# Patient Record
Sex: Male | Born: 1990 | Race: Black or African American | Hispanic: No | Smoking: Never smoker
Health system: Southern US, Community
[De-identification: ages and names within clinical notes are randomized; demographics above are authoritative.]

---

## 2016-07-30 ENCOUNTER — Encounter (HOSPITAL_COMMUNITY): Payer: Self-pay | Admitting: Emergency Medicine

## 2016-07-30 ENCOUNTER — Emergency Department (HOSPITAL_COMMUNITY)
Admission: EM | Admit: 2016-07-30 | Discharge: 2016-07-30 | Disposition: A | Discharge status: Home / Self Care | Payer: Medicare HMO | Attending: Emergency Medicine | Admitting: Emergency Medicine

## 2016-07-30 DIAGNOSIS — S8992XA Unspecified injury of left lower leg, initial encounter: Secondary | ICD-10-CM | POA: Diagnosis present

## 2016-07-30 DIAGNOSIS — Y93A2 Activity, calisthenics: Secondary | ICD-10-CM | POA: Diagnosis not present

## 2016-07-30 DIAGNOSIS — Y999 Unspecified external cause status: Secondary | ICD-10-CM | POA: Diagnosis not present

## 2016-07-30 DIAGNOSIS — M79662 Pain in left lower leg: Secondary | ICD-10-CM | POA: Diagnosis not present

## 2016-07-30 DIAGNOSIS — Y9289 Other specified places as the place of occurrence of the external cause: Secondary | ICD-10-CM | POA: Insufficient documentation

## 2016-07-30 DIAGNOSIS — X509XXA Other and unspecified overexertion or strenuous movements or postures, initial encounter: Secondary | ICD-10-CM | POA: Diagnosis not present

## 2016-07-30 DIAGNOSIS — M79605 Pain in left leg: Secondary | ICD-10-CM

## 2016-07-30 MED ORDER — IBUPROFEN 600 MG PO TABS: 600.0000 mg | ORAL_TABLET | Freq: Four times a day (QID) | ORAL | 0 refills | Status: DC | PRN

## 2016-07-30 NOTE — ED Provider Notes (Signed)
[facility]EMER[facility]ENCY DEPT Provider Note   CSN: [id number] Arrival date & time: 07/30/16  4098     History   Chief Complaint Chief Complaint  Patient presents with  . Leg Pain    HPI Mark Floyd is a 26 y.o. male.  HPI   Mark Floyd is a 26 y.o. male, patient with no pertinent past medical history, presenting to the ED with left lower leg pain and swelling beginning yesterday. Patient endorses increase in his physical activity including calisthenics such as jumping jacks. Describes the pain as a mild soreness. Plantar flexion and ambulation worsen the pain. He has tried Epsom salt baths without relief. Denies neuro deficits, trauma, or any other complaints No recent travel, immobilization, surgery, or orthopedic trauma. No history of DVT/PE.       History reviewed. No pertinent past medical history.  There are no active problems to display for this patient.   History reviewed. No pertinent surgical history.     Home Medications    Prior to Admission medications   Medication Sig Start Date End Date Taking? Authorizing Provider  ibuprofen (ADVIL,MOTRIN) 600 MG tablet Take 1 tablet (600 mg total) by mouth every 6 (six) hours as needed. 07/30/16   Anselm Pancoast, PA-C    Family History History reviewed. No pertinent family history.  Social History Social History  Substance Use Topics  . Smoking status: Never Smoker  . Smokeless tobacco: Never Used  . Alcohol use Yes     Allergies   Patient has no known allergies.   Review of Systems Review of Systems  Musculoskeletal: Positive for myalgias. Negative for back pain.  Skin: Negative for wound.  Neurological: Negative for weakness and numbness.     Physical Exam Updated Vital Signs BP (!) 159/97 (BP Location: Left Arm)   Pulse 78   Temp 97.4 F (36.3 C) (Oral)   Resp 20   SpO2 100%   Physical Exam  Constitutional: He appears well-developed and well-nourished. No distress.  HENT:  Head:  Normocephalic and atraumatic.  Eyes: Conjunctivae are normal.  Neck: Neck supple.  Cardiovascular: Normal rate, regular rhythm and intact distal pulses.   Pulmonary/Chest: Effort normal.  Musculoskeletal: He exhibits tenderness.  Mild tenderness over anterior lower left leg, lateral to tibia. No tenderness over tibia or ankle. No tenderness to the calf. No erythema or increased warmth. Plantar and dorsiflexion increased pain. Patient is ambulatory.  Neurological: He is alert.  No sensory deficits in the lower left extremity. Strength 5 out of 5 with extension and flexion of the left knee as well as dorsiflexion and plantar flexion on the left.  Skin: Skin is warm and dry. Capillary refill takes less than 2 seconds. He is not diaphoretic.  Psychiatric: He has a normal mood and affect. His behavior is normal.  Nursing note and vitals reviewed.    ED Treatments / Results  Labs (all labs ordered are listed, but only abnormal results are displayed) Labs Reviewed - No data to display  EKG  EKG Interpretation None       Radiology No results found.  Procedures Procedures (including critical care time)  Medications Ordered in ED Medications - No data to display   Initial Impression / Assessment and Plan / ED Course  I have reviewed the triage vital signs and the nursing notes.  Pertinent labs & imaging results that were available during my care of the patient were reviewed by me and considered in my medical decision making (see  chart for details).      Patient presents with lower leg pain. Very low suspicion for DVT. Wells criteria for DVT is 0. Supportive care and return precautions discussed. Patient voiced understanding of all instructions and is comfortable with discharge.    Final Clinical Impressions(s) / ED Diagnoses   Final diagnoses:  Left leg pain    New Prescriptions New Prescriptions   IBUPROFEN (ADVIL,MOTRIN) 600 MG TABLET    Take 1 tablet (600 mg total)  by mouth every 6 (six) hours as needed.     Anselm PancoastJoy, Keneshia Tena C, PA-C 07/30/16 1025    Alvira MondaySchlossman, Erin, MD 08/01/16 Moses Manners0025

## 2016-07-30 NOTE — ED Triage Notes (Signed)
Pt sts left leg pain in shin area

## 2016-07-30 NOTE — Discharge Instructions (Signed)
°  Pain: Take 600 mg of ibuprofen every 6 hours or 440 mg of naproxen every 12 hours for the next 3 days. After this, may take ibuprofen or naproxen as needed to reduce pain and inflammation. Take these types of medications with food to avoid upset stomach. Choose one of these medications, but not both.  Ice: May apply ice to the area over the next 24 hours for 15 minutes at a time to reduce swelling. Elevation: Keep the extremity elevated as often as possible to reduce pain and inflammation. Exercises: Start by performing these exercises a few times a week, increasing the frequency until you are performing them twice daily.  Follow up: If symptoms are improving, you may follow up with your primary care provider for any continued management. If symptoms are not improving, you may follow up with the orthopedic specialist. If symptoms worsen, return to the ED.

## 2016-08-03 ENCOUNTER — Ambulatory Visit (INDEPENDENT_AMBULATORY_CARE_PROVIDER_SITE_OTHER): Payer: Medicare HMO

## 2016-08-03 ENCOUNTER — Ambulatory Visit (HOSPITAL_COMMUNITY)
Admission: EM | Admit: 2016-08-03 | Discharge: 2016-08-03 | Disposition: A | Discharge status: Home / Self Care | Payer: Medicare HMO | Attending: Internal Medicine | Admitting: Internal Medicine

## 2016-08-03 ENCOUNTER — Encounter (HOSPITAL_COMMUNITY): Payer: Self-pay | Admitting: Emergency Medicine

## 2016-08-03 DIAGNOSIS — M79605 Pain in left leg: Secondary | ICD-10-CM

## 2016-08-03 MED ORDER — PREDNISONE 10 MG PO TABS: ORAL_TABLET | ORAL | 0 refills | Status: DC

## 2016-08-03 NOTE — ED Triage Notes (Signed)
Pt c/o LLE pain onset 4-5 days  [facility]  Does not recall any inj/trauma  Steady gait... Ambulatory... NAD... A&O x4

## 2016-08-03 NOTE — ED Provider Notes (Signed)
CSN: [id number]     Arrival date & time 08/03/16  1636 History   None    Chief Complaint  Patient presents with  . Leg Pain   (Consider location/radiation/quality/duration/timing/severity/associated sxs/prior Treatment) The history is provided by the patient. No language interpreter was used.  Leg Pain  Location:  Leg Leg location:  L leg Pain details:    Quality:  Aching   Radiates to:  Does not radiate   Severity:  Moderate   Onset quality:  Gradual   Timing:  Constant   Progression:  Worsening Chronicity:  New Dislocation: no   Foreign body present:  No foreign bodies Relieved by:  Nothing Worsened by:  Nothing Ineffective treatments:  None tried  Pt complains of swelling to the front of his left leg.  History reviewed. No pertinent past medical history. History reviewed. No pertinent surgical history. History reviewed. No pertinent family history. Social History  Substance Use Topics  . Smoking status: Never Smoker  . Smokeless tobacco: Never Used  . Alcohol use Yes    Review of Systems  All other systems reviewed and are negative.   Allergies  Patient has no known allergies.  Home Medications   Prior to Admission medications   Medication Sig Start Date End Date Taking? Authorizing Provider  ibuprofen (ADVIL,MOTRIN) 600 MG tablet Take 1 tablet (600 mg total) by mouth every 6 (six) hours as needed. 07/30/16   Joy, Hillard Danker, PA-C  predniSONE (DELTASONE) 10 MG tablet 6,5,4,3,2,1 taper 08/03/16   Elson Areas, PA-C   Meds Ordered and Administered this Visit  Medications - No data to display  BP 135/86 (BP Location: Left Arm)   Pulse 95   Temp 98.8 F (37.1 C) (Oral)   Resp 16   SpO2 96%  No data found.   Physical Exam  Constitutional: He is oriented to person, place, and time. He appears well-developed and well-nourished.  HENT:  Head: Normocephalic.  Musculoskeletal: He exhibits tenderness.  Tender anterior shin,  Edematous,  No heat.    Neurological: He is alert and oriented to person, place, and time.  Skin: Skin is warm.  Psychiatric: He has a normal mood and affect.    Urgent Care Course     Procedures (including critical care time)  Labs Review Labs Reviewed - No data to display  Imaging Review Dg Tibia/fibula Left  Result Date: 08/03/2016 CLINICAL DATA:  Leg swelling EXAM: LEFT TIBIA AND FIBULA - 2 VIEW COMPARISON:  None. FINDINGS: No acute bony abnormality is seen. Mild soft tissue edema is noted in the upper lay consistent with the given clinical history. IMPRESSION: No acute bony abnormality noted.  Mild soft tissue swelling is seen. Electronically Signed   By: Alcide Clever M.D.   On: 08/03/2016 18:47     Visual Acuity Review  Right Eye Distance:   Left Eye Distance:   Bilateral Distance:    Right Eye Near:   Left Eye Near:    Bilateral Near:         MDM negative homan's no sign of dvt.  All swelling is anterior,    1. Left leg pain    An After Visit Summary was printed and given to the patient. Meds ordered this encounter  Medications  . predniSONE (DELTASONE) 10 MG tablet    Sig: 6,5,4,3,2,1 taper    Dispense:  21 tablet    Refill:  0    Order Specific Question:   Supervising Provider    Answer:  Eustace MooreMURRAY, LAURA W [[id number]]       Elson AreasSofia, Oluwanifemi Susman K, [place]PA-C 08/03/16 2021

## 2016-08-07 ENCOUNTER — Ambulatory Visit (HOSPITAL_COMMUNITY)
Admission: EM | Admit: 2016-08-07 | Discharge: 2016-08-07 | Disposition: A | Discharge status: Home / Self Care | Payer: Medicare HMO | Attending: Family Medicine | Admitting: Family Medicine

## 2016-08-07 ENCOUNTER — Encounter (HOSPITAL_COMMUNITY): Payer: Self-pay | Admitting: *Deleted

## 2016-08-07 DIAGNOSIS — M79605 Pain in left leg: Secondary | ICD-10-CM

## 2016-08-07 MED ORDER — PREDNISONE 10 MG (21) PO TBPK: ORAL_TABLET | Freq: Every day | ORAL | 0 refills | Status: DC

## 2016-08-07 NOTE — ED Triage Notes (Signed)
Pt  States   Was  Moving   3  Days     Ago     And  inj  l   Ankle    Swelling  Noted      Pt  Here  Today  For  followup      Pt  Has  A  Cam  Walker  In place  On  Arrival

## 2016-08-07 NOTE — ED Provider Notes (Signed)
CSN: [id number]     Arrival date & time 08/07/16  1016 History   First MD Initiated Contact with Patient 08/07/16 1056     Chief Complaint  Patient presents with  . Follow-up   (Consider location/radiation/quality/duration/timing/severity/associated sxs/prior Treatment) 26 year old male presents to clinic with a chief complaint of left lower leg pain and swelling. He arrived to clinic wearing a cam walker, he has been seen twice before for this complaint, the first on 07/30/2016, and the second Aug 03 2016, states that he initially injured his leg, while helping a friend move. He is unsure how was injured, as he did not fall, and he did not twist his ankle. Both workups were negative with low index of suspicion from the providers for DVT/PE, cellulitis, lymphedema, etc. He was placed on a course of steroids, along with being given a cam walker. Today he states his swelling and pain is significantly improved, and he has much more able to walk, and bear weight, however he is still having some residual swelling, and some minor pain, 2 out of 10. He has not yet followed up with orthopedics, and is requesting an extension of the prednisone.   The history is provided by the patient.    History reviewed. No pertinent past medical history. History reviewed. No pertinent surgical history. History reviewed. No pertinent family history. Social History  Substance Use Topics  . Smoking status: Never Smoker  . Smokeless tobacco: Never Used  . Alcohol use Yes    Review of Systems  Constitutional: Negative.   HENT: Negative.   Respiratory: Negative.   Cardiovascular: Negative.   Gastrointestinal: Negative.   Musculoskeletal:       Left leg pain and swelling  Skin: Negative.   Neurological: Negative.     Allergies  Patient has no known allergies.  Home Medications   Prior to Admission medications   Medication Sig Start Date End Date Taking? Authorizing Provider  ibuprofen (ADVIL,MOTRIN) 600  MG tablet Take 1 tablet (600 mg total) by mouth every 6 (six) hours as needed. 07/30/16   Joy, Shawn C, PA-C  predniSONE (STERAPRED UNI-PAK 21 TAB) 10 MG (21) TBPK tablet Take by mouth daily. Take 6 tabs by mouth daily  for 2 days, then 5 tabs for 2 days, then 4 tabs for 2 days, then 3 tabs for 2 days, 2 tabs for 2 days, then 1 tab by mouth daily for 2 days 08/07/16   Dorena Bodo, NP   Meds Ordered and Administered this Visit  Medications - No data to display  BP 132/70 (BP Location: Right Arm)   Pulse 78   Temp 98.6 F (37 C) (Oral)   Resp 18   SpO2 100%  No data found.   Physical Exam  Constitutional: He is oriented to person, place, and time. He appears well-developed and well-nourished. No distress.  HENT:  Head: Normocephalic.  Right Ear: External ear normal.  Left Ear: External ear normal.  Eyes: Conjunctivae are normal.  Neck: Normal range of motion.  Cardiovascular: Normal rate and regular rhythm.   Musculoskeletal: He exhibits edema. He exhibits no deformity.  Left lower leg swelling noted, no erythema, or warmth, pulses remain intact distally, capillary refill less than 2 seconds, no tenderness over the medial, or lateral malleolus, cuboid, 5th metatarsal, or the base of the great toe. Negative Homans sign, no palpable cords.  Neurological: He is alert and oriented to person, place, and time.  Skin: Skin is warm and dry. Capillary refill  takes less than 2 seconds. He is not diaphoretic.  Psychiatric: He has a normal mood and affect. His behavior is normal.  Nursing note and vitals reviewed.   Urgent Care Course     Procedures (including critical care time)  Labs Review Labs Reviewed - No data to display  Imaging Review No results found.    MDM   1. Pain of left lower extremity    Education provided to the patient on the effects of long-term use of steroids, and the various issues that can be called by long-term use of steroids, patient provided a new  taper, but strongly encouraged not to take any additional steroids for at least 4 months or longer, provided contact information for orthopedist, and recommend he follow up with him for further treatment and evaluation of his condition if necessary.    Dorena BodoKennard, Treyshaun Keatts, NP 08/07/16 1118

## 2016-08-07 NOTE — Discharge Instructions (Signed)
I have increased the length of your steroid taper, once you are finished with this taper, I would highly advise against use of steroids again for at least 4 months. I have provided the contact information for orthopedist, I would recommend following up with him for further evaluation and management of your condition.

## 2016-08-09 NOTE — ED Notes (Signed)
Doctor, general practiceront  Office   Staff     Stated  Patient needed  A  referell     Records  Printed    And  Given to  [employer] [facility]   Records

## 2016-09-19 ENCOUNTER — Encounter (HOSPITAL_COMMUNITY): Payer: Self-pay | Admitting: Emergency Medicine

## 2016-09-19 ENCOUNTER — Emergency Department (HOSPITAL_COMMUNITY)
Admission: EM | Admit: 2016-09-19 | Discharge: 2016-09-19 | Disposition: A | Discharge status: Home / Self Care | Payer: Medicare HMO | Attending: Emergency Medicine | Admitting: Emergency Medicine

## 2016-09-19 DIAGNOSIS — Y939 Activity, unspecified: Secondary | ICD-10-CM | POA: Diagnosis not present

## 2016-09-19 DIAGNOSIS — Y999 Unspecified external cause status: Secondary | ICD-10-CM | POA: Insufficient documentation

## 2016-09-19 DIAGNOSIS — M25512 Pain in left shoulder: Secondary | ICD-10-CM | POA: Insufficient documentation

## 2016-09-19 DIAGNOSIS — Y92410 Unspecified street and highway as the place of occurrence of the external cause: Secondary | ICD-10-CM | POA: Insufficient documentation
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
## 2016-09-19 NOTE — Discharge Instructions (Signed)
Return if any problems.

## 2016-11-02 ENCOUNTER — Emergency Department (HOSPITAL_COMMUNITY): Payer: No Typology Code available for payment source

## 2016-11-02 ENCOUNTER — Emergency Department (HOSPITAL_COMMUNITY)
Admission: EM | Admit: 2016-11-02 | Discharge: 2016-11-02 | Disposition: A | Discharge status: Home / Self Care | Payer: No Typology Code available for payment source | Attending: Emergency Medicine | Admitting: Emergency Medicine

## 2016-11-02 ENCOUNTER — Encounter (HOSPITAL_COMMUNITY): Payer: Self-pay | Admitting: Emergency Medicine

## 2016-11-02 DIAGNOSIS — R51 Headache: Secondary | ICD-10-CM | POA: Insufficient documentation

## 2016-11-02 DIAGNOSIS — Y999 Unspecified external cause status: Secondary | ICD-10-CM | POA: Diagnosis not present

## 2016-11-02 DIAGNOSIS — R000 Tachycardia, unspecified: Secondary | ICD-10-CM | POA: Insufficient documentation

## 2016-11-02 DIAGNOSIS — Y929 Unspecified place or not applicable: Secondary | ICD-10-CM | POA: Insufficient documentation

## 2016-11-02 DIAGNOSIS — Y939 Activity, unspecified: Secondary | ICD-10-CM | POA: Insufficient documentation

## 2016-11-02 DIAGNOSIS — S80811A Abrasion, right lower leg, initial encounter: Secondary | ICD-10-CM | POA: Diagnosis not present

## 2016-11-02 DIAGNOSIS — T148XXA Other injury of unspecified body region, initial encounter: Secondary | ICD-10-CM | POA: Diagnosis present

## 2016-11-02 LAB — COMPREHENSIVE METABOLIC PANEL
ALBUMIN: 4.6 g/dL (ref 3.5–5.0)
ALK PHOS: 55 U/L (ref 38–126)
ALT: 28 U/L (ref 17–63)
ANION GAP: 9 (ref 5–15)
AST: 25 U/L (ref 15–41)
BUN: 16 mg/dL (ref 6–20)
CALCIUM: 9.8 mg/dL (ref 8.9–10.3)
CO2: 26 mmol/L (ref 22–32)
Chloride: 105 mmol/L (ref 101–111)
Creatinine, Ser: 1.38 mg/dL — ABNORMAL HIGH (ref 0.61–1.24)
GFR calc Af Amer: >60 mL/min (ref >60)
GFR calc non Af Amer: >60 mL/min (ref >60)
GLUCOSE: 152 mg/dL — AB (ref 65–99)
Potassium: 3.8 mmol/L (ref 3.5–5.1)
SODIUM: 140 mmol/L (ref 135–145)
Total Bilirubin: 0.7 mg/dL (ref 0.3–1.2)
Total Protein: 7.4 g/dL (ref 6.5–8.1)

## 2016-11-02 LAB — CBC
HEMATOCRIT: 38.5 % — AB (ref 39.0–52.0)
HEMOGLOBIN: 13.3 g/dL (ref 13.0–17.0)
MCH: 27 pg (ref 26.0–34.0)
MCHC: 34.5 g/dL (ref 30.0–36.0)
MCV: 78.1 fL (ref 78.0–100.0)
Platelets: 309 10*3/uL (ref 150–400)
RBC: 4.93 MIL/uL (ref 4.22–5.81)
RDW: 12.9 % (ref 11.5–15.5)
WBC: 6.7 10*3/uL (ref 4.0–10.5)

## 2016-11-02 MED ORDER — CYCLOBENZAPRINE HCL 10 MG PO TABS: 10.0000 mg | ORAL_TABLET | Freq: Two times a day (BID) | ORAL | 0 refills | Status: DC | PRN

## 2016-11-02 MED ORDER — SODIUM CHLORIDE 0.9 % IV BOLUS (SEPSIS)
1000.0000 mL | Freq: Once | INTRAVENOUS | Status: AC
  Administered 2016-11-02: 1000 mL via INTRAVENOUS
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
## 2016-11-02 NOTE — ED Notes (Signed)
X-ray at bedside

## 2016-11-02 NOTE — ED Notes (Signed)
Negative FAST exam.

## 2016-11-02 NOTE — ED Notes (Signed)
Pt transported to CT ?
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
## 2016-11-02 NOTE — Discharge Instructions (Signed)
Your workup was reassuring today. Please follow-up with your primary care physician with any worsening of symptoms. Take Motrin and Tylenol for pain. No drinking or driving while taking muscle relaxants.
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
## 2016-11-05 ENCOUNTER — Encounter (HOSPITAL_COMMUNITY): Payer: Self-pay | Admitting: Emergency Medicine

## 2018-08-25 ENCOUNTER — Emergency Department (HOSPITAL_COMMUNITY)
Admission: EM | Admit: 2018-08-25 | Discharge: 2018-08-25 | Disposition: A | Discharge status: Home / Self Care | Payer: Medicare HMO | Attending: Emergency Medicine | Admitting: Emergency Medicine

## 2018-08-25 ENCOUNTER — Other Ambulatory Visit: Payer: Self-pay

## 2018-08-25 ENCOUNTER — Encounter (HOSPITAL_COMMUNITY): Payer: Self-pay

## 2018-08-25 ENCOUNTER — Emergency Department (HOSPITAL_COMMUNITY): Payer: Medicare HMO

## 2018-08-25 DIAGNOSIS — Y9389 Activity, other specified: Secondary | ICD-10-CM | POA: Diagnosis not present

## 2018-08-25 DIAGNOSIS — M542 Cervicalgia: Secondary | ICD-10-CM | POA: Insufficient documentation

## 2018-08-25 DIAGNOSIS — Y998 Other external cause status: Secondary | ICD-10-CM | POA: Insufficient documentation

## 2018-08-25 DIAGNOSIS — Y92410 Unspecified street and highway as the place of occurrence of the external cause: Secondary | ICD-10-CM | POA: Insufficient documentation

## 2018-08-25 DIAGNOSIS — M79642 Pain in left hand: Secondary | ICD-10-CM | POA: Insufficient documentation

## 2018-08-25 MED ORDER — NAPROXEN 500 MG PO TABS: 500.0000 mg | ORAL_TABLET | Freq: Two times a day (BID) | ORAL | 0 refills | Status: AC

## 2018-08-25 MED ORDER — ACETAMINOPHEN 325 MG PO TABS
650.0000 mg | ORAL_TABLET | Freq: Once | ORAL | Status: AC
  Administered 2018-08-25: 650 mg via ORAL
  Filled 2018-08-25: qty 2

## 2018-08-25 MED ORDER — METHOCARBAMOL 500 MG PO TABS: 500.0000 mg | ORAL_TABLET | Freq: Two times a day (BID) | ORAL | 0 refills | Status: AC

## 2018-08-25 NOTE — Discharge Instructions (Addendum)
I have prescribed muscle relaxers for your pain, please do not drink or drive while taking this medications as they can make you drowsy.    Please follow-up with PCP in 1 week for reevaluation of your symptoms.   
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
## 2018-08-25 NOTE — ED Notes (Signed)
Bed: WTR7 Expected date:  Expected time:  Means of arrival:  Comments: 
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
## 2019-01-20 ENCOUNTER — Other Ambulatory Visit: Payer: Self-pay

## 2019-01-20 ENCOUNTER — Encounter (HOSPITAL_COMMUNITY): Payer: Self-pay

## 2019-01-20 ENCOUNTER — Ambulatory Visit (HOSPITAL_COMMUNITY)
Admission: EM | Admit: 2019-01-20 | Discharge: 2019-01-20 | Disposition: A | Discharge status: Home / Self Care | Payer: Medicare HMO | Attending: Family Medicine | Admitting: Family Medicine

## 2019-01-20 DIAGNOSIS — Z20828 Contact with and (suspected) exposure to other viral communicable diseases: Secondary | ICD-10-CM | POA: Diagnosis present

## 2019-01-20 DIAGNOSIS — Z20822 Contact with and (suspected) exposure to covid-19: Secondary | ICD-10-CM

## 2019-01-20 NOTE — ED Triage Notes (Signed)
[facility] for [facility] test. Pt reports 1 of his coworker was exposed to a positive Covid person and his job is requires a Covid test to go back to work. Pt report last the last time he was exposed to his coworker was 1 week ago. Pt denies any signs and symptoms.

## 2019-01-20 NOTE — Discharge Instructions (Signed)
Person Under Monitoring Name: Mark Floyd  Location: [address] [place] [address]   Infection Prevention Recommendations for Individuals Confirmed to have, or Being Evaluated for, 2019 Novel Coronavirus (COVID-19) Infection Who Receive Care at Home  Individuals who are confirmed to have, or are being evaluated for, COVID-19 should follow the prevention steps below until a healthcare provider or local or state health department says they can return to normal activities.  Stay home except to get medical care You should restrict activities outside your home, except for getting medical care. Do not go to work, school, or public areas, and do not use public transportation or taxis.  Call ahead before visiting your doctor Before your medical appointment, call the healthcare provider and tell them that you have, or are being evaluated for, COVID-19 infection. This will help the healthcare providers office take steps to keep other people from getting infected. Ask your healthcare provider to call the local or state health department.  Monitor your symptoms Seek prompt medical attention if your illness is worsening (e.g., difficulty breathing). Before going to your medical appointment, call the healthcare provider and tell them that you have, or are being evaluated for, COVID-19 infection. Ask your healthcare provider to call the local or state health department.  Wear a facemask You should wear a facemask that covers your nose and mouth when you are in the same room with other people and when you visit a healthcare provider. People who live with or visit you should also wear a facemask while they are in the same room with you.  Separate yourself from other people in your home As much as possible, you should stay in a different room from other people in your home. Also, you should use a separate bathroom, if available.  Avoid sharing household items You should  not share dishes, drinking glasses, cups, eating utensils, towels, bedding, or other items with other people in your home. After using these items, you should wash them thoroughly with soap and water.  Cover your coughs and sneezes Cover your mouth and nose with a tissue when you cough or sneeze, or you can cough or sneeze into your sleeve. Throw used tissues in a lined trash can, and immediately wash your hands with soap and water for at least 20 seconds or use an alcohol-based hand rub.  Wash your [facility] your hands often and thoroughly with soap and water for at least 20 seconds. You can use an alcohol-based hand sanitizer if soap and water are not available and if your hands are not visibly dirty. Avoid touching your eyes, nose, and mouth with unwashed hands.   Prevention Steps for Caregivers and Household Members of Individuals Confirmed to have, or Being Evaluated for, COVID-19 Infection Being Cared for in the Home  If you live with, or provide care at home for, a person confirmed to have, or being evaluated for, COVID-19 infection please follow these guidelines to prevent infection:  Follow healthcare providers instructions Make sure that you understand and can help the patient follow any healthcare provider instructions for all care.  Provide for the patients basic needs You should help the patient with basic needs in the home and provide support for getting groceries, prescriptions, and other personal needs.  Monitor the patients symptoms If they are getting sicker, call his or her medical provider and tell them that the patient has, or is being evaluated for, COVID-19 infection. This will help the healthcare  providers office take steps to keep other people from getting infected. Ask the healthcare provider to call the local or state health department.  Limit the number of people who have contact with the patient If possible, have only one caregiver for the  patient. Other household members should stay in another home or place of residence. If this is not possible, they should stay in another room, or be separated from the patient as much as possible. Use a separate bathroom, if available. Restrict visitors who do not have an essential need to be in the home.  Keep older adults, very young children, and other sick people away from the patient Keep older adults, very young children, and those who have compromised immune systems or chronic health conditions away from the patient. This includes people with chronic heart, lung, or kidney conditions, diabetes, and cancer.  Ensure good ventilation Make sure that shared spaces in the home have good air flow, such as from an air conditioner or an opened window, weather permitting.  Wash your hands often Wash your hands often and thoroughly with soap and water for at least 20 seconds. You can use an alcohol based hand sanitizer if soap and water are not available and if your hands are not visibly dirty. Avoid touching your eyes, nose, and mouth with unwashed hands. Use disposable paper towels to dry your hands. If not available, use dedicated cloth towels and replace them when they become wet.  Wear a facemask and gloves Wear a disposable facemask at all times in the room and gloves when you touch or have contact with the patients blood, body fluids, and/or secretions or excretions, such as sweat, saliva, sputum, nasal mucus, vomit, urine, or feces.  Ensure the mask fits over your nose and mouth tightly, and do not touch it during use. Throw out disposable facemasks and gloves after using them. Do not reuse. Wash your hands immediately after removing your facemask and gloves. If your personal clothing becomes contaminated, carefully remove clothing and launder. Wash your hands after handling contaminated clothing. Place all used disposable facemasks, gloves, and other waste in a lined container before  disposing them with other household waste. Remove gloves and wash your hands immediately after handling these items.  Do not share dishes, glasses, or other household items with the patient Avoid sharing household items. You should not share dishes, drinking glasses, cups, eating utensils, towels, bedding, or other items with a patient who is confirmed to have, or being evaluated for, COVID-19 infection. After the person uses these items, you should wash them thoroughly with soap and water.  Wash laundry thoroughly Immediately remove and wash clothes or bedding that have blood, body fluids, and/or secretions or excretions, such as sweat, saliva, sputum, nasal mucus, vomit, urine, or feces, on them. Wear gloves when handling laundry from the patient. Read and follow directions on labels of laundry or clothing items and detergent. In general, wash and dry with the warmest temperatures recommended on the label.  Clean all areas the individual has used often Clean all touchable surfaces, such as counters, tabletops, doorknobs, bathroom fixtures, toilets, phones, keyboards, tablets, and bedside tables, every day. Also, clean any surfaces that may have blood, body fluids, and/or secretions or excretions on them. Wear gloves when cleaning surfaces the patient has come in contact with. Use a diluted bleach solution (e.g., dilute bleach with 1 part bleach and 10 parts water) or a household disinfectant with a label that says EPA-registered for coronaviruses. To make  a bleach solution at home, add 1 tablespoon of bleach to 1 quart (4 cups) of water. For a larger supply, add  cup of bleach to 1 gallon (16 cups) of water. Read labels of cleaning products and follow recommendations provided on product labels. Labels contain instructions for safe and effective use of the cleaning product including precautions you should take when applying the product, such as wearing gloves or eye protection and making sure you  have good ventilation during use of the product. Remove gloves and wash hands immediately after cleaning.  Monitor yourself for signs and symptoms of illness Caregivers and household members are considered close contacts, should monitor their health, and will be asked to limit movement outside of the home to the extent possible. Follow the monitoring steps for close contacts listed on the symptom monitoring form.   ? If you have additional questions, contact your local health department or call the epidemiologist on call at [phone] (available 24/7). ? This guidance is subject to change. For the most up-to-date guidance from [facility], please refer to their website: YouBlogs.pl

## 2019-01-21 NOTE — ED Provider Notes (Signed)
[facility]    CSN: [id number] Arrival date & time: 01/20/19  1319      History   Chief Complaint Chief Complaint  Patient presents with  . covid test    HPI Mark Floyd is a 28 y.o. male no significant past medical history presenting today for Covid testing.  Patient states that he had exposure to a coworker who recently tested positive last week.  He has not developed any symptoms himself.  He denies any URI symptoms of cough, congestion, sore throat.  Denies fevers chills or body aches.  Denies nausea vomiting or diarrhea.  No other known exposures.  Work is requiring him to test negative prior to returning.  HPI  History reviewed. No pertinent past medical history.  There are no active problems to display for this patient.   History reviewed. No pertinent surgical history.     Home Medications    Prior to Admission medications   Medication Sig Start Date End Date Taking? Authorizing Provider  b complex vitamins capsule Take 1 capsule by mouth daily.    [provider]  calcium carbonate (OS-CAL - DOSED IN MG OF ELEMENTAL CALCIUM) 1250 (500 Ca) MG tablet Take 1 tablet by mouth daily.    [provider]  cyclobenzaprine (FLEXERIL) 10 MG tablet Take 1 tablet (10 mg total) by mouth 2 (two) times daily as needed for muscle spasms. 11/02/16   Wynelle Cleveland, MD  ibuprofen (ADVIL,MOTRIN) 600 MG tablet Take 1 tablet (600 mg total) by mouth every 6 (six) hours as needed. 07/30/16   Joy, Shawn C, PA-C  predniSONE (STERAPRED UNI-PAK 21 TAB) 10 MG (21) TBPK tablet Take by mouth daily. Take 6 tabs by mouth daily  for 2 days, then 5 tabs for 2 days, then 4 tabs for 2 days, then 3 tabs for 2 days, 2 tabs for 2 days, then 1 tab by mouth daily for 2 days 08/07/16   Dorena Bodo, NP    Family History History reviewed. No pertinent family history.  Social History Social History   Tobacco Use  . Smoking status: Never Smoker  . Smokeless  tobacco: Never Used  Substance Use Topics  . Alcohol use: Yes  . Drug use: No     Allergies   Patient has no known allergies.   Review of Systems Review of Systems  Constitutional: Negative for activity change, appetite change, chills, fatigue and fever.  HENT: Negative for congestion, ear pain, rhinorrhea, sinus pressure, sore throat and trouble swallowing.   Eyes: Negative for discharge and redness.  Respiratory: Negative for cough, chest tightness and shortness of breath.   Cardiovascular: Negative for chest pain.  Gastrointestinal: Negative for abdominal pain, diarrhea, nausea and vomiting.  Musculoskeletal: Negative for myalgias.  Skin: Negative for rash.  Neurological: Negative for dizziness, light-headedness and headaches.     Physical Exam Triage Vital Signs ED Triage Vitals  Enc Vitals Group     BP 01/20/19 1354 (!) 142/72     Pulse Rate 01/20/19 1354 69     Resp 01/20/19 1354 16     Temp 01/20/19 1354 97.7 F (36.5 C)     Temp Source 01/20/19 1354 Temporal     SpO2 01/20/19 1354 100 %     Weight --      Height --      Head Circumference --      Peak Flow --      Pain Score 01/20/19 1351 0  Pain Loc --      Pain Edu? --      Excl. in GC? --    No data found.  Updated Vital Signs BP (!) 142/72 (BP Location: Left Arm)   Pulse 69   Temp 97.7 F (36.5 C) (Temporal)   Resp 16   SpO2 100%   Visual Acuity Right Eye Distance:   Left Eye Distance:   Bilateral Distance:    Right Eye Near:   Left Eye Near:    Bilateral Near:     Physical Exam Vitals signs and nursing note reviewed.  Constitutional:      Appearance: He is well-developed.     Comments: No acute distress  HENT:     Head: Normocephalic and atraumatic.     Nose: Nose normal.  Eyes:     Conjunctiva/sclera: Conjunctivae normal.  Neck:     Musculoskeletal: Neck supple.  Cardiovascular:     Rate and Rhythm: Normal rate.  Pulmonary:     Effort: Pulmonary effort is normal. No  respiratory distress.  Abdominal:     General: There is no distension.  Musculoskeletal: Normal range of motion.  Skin:    General: Skin is warm and dry.  Neurological:     Mental Status: He is alert and oriented to person, place, and time.      UC Treatments / Results  Labs (all labs ordered are listed, but only abnormal results are displayed) Labs Reviewed  NOVEL CORONAVIRUS, NAA (HOSP ORDER, SEND-OUT TO REF LAB; TAT 18-24 HRS)    EKG   Radiology No results found.  Procedures Procedures (including critical care time)  Medications Ordered in UC Medications - No data to display  Initial Impression / Assessment and Plan / UC Course  I have reviewed the triage vital signs and the nursing notes.  Pertinent labs & imaging results that were available during my care of the patient were reviewed by me and considered in my medical decision making (see chart for details).     Covid swab pending.  Currently asymptomatic.  Will have quarantine until results return.  Monitor my chart, follow-up if developing any symptoms.Discussed strict return precautions. Patient verbalized understanding and is agreeable with plan.  Final Clinical Impressions(s) / UC Diagnoses   Final diagnoses:  Exposure to COVID-19 virus     Discharge Instructions        Person Under Monitoring Name: MAIKEL NEISLER  Location: [address] [place] [address]   Infection Prevention Recommendations for Individuals Confirmed to have, or Being Evaluated for, 2019 Novel Coronavirus (COVID-19) Infection Who Receive Care at Home  Individuals who are confirmed to have, or are being evaluated for, COVID-19 should follow the prevention steps below until a healthcare provider or local or state health department says they can return to normal activities.  Stay home except to get medical care You should restrict activities outside your home, except for getting medical care. Do not go to  work, school, or public areas, and do not use public transportation or taxis.  Call ahead before visiting your doctor Before your medical appointment, call the healthcare provider and tell them that you have, or are being evaluated for, COVID-19 infection. This will help the healthcare provider's office take steps to keep other people from getting infected. Ask your healthcare provider to call the local or state health department.  Monitor your symptoms Seek prompt medical attention if your illness is worsening (e.g., difficulty breathing). Before going to  your medical appointment, call the healthcare provider and tell them that you have, or are being evaluated for, COVID-19 infection. Ask your healthcare provider to call the local or state health department.  Wear a facemask You should wear a facemask that covers your nose and mouth when you are in the same room with other people and when you visit a healthcare provider. People who live with or visit you should also wear a facemask while they are in the same room with you.  Separate yourself from other people in your home As much as possible, you should stay in a different room from other people in your home. Also, you should use a separate bathroom, if available.  Avoid sharing household items You should not share dishes, drinking glasses, cups, eating utensils, towels, bedding, or other items with other people in your home. After using these items, you should wash them thoroughly with soap and water.  Cover your coughs and sneezes Cover your mouth and nose with a tissue when you cough or sneeze, or you can cough or sneeze into your sleeve. Throw used tissues in a lined trash can, and immediately wash your hands with soap and water for at least 20 seconds or use an alcohol-based hand rub.  Wash your [facility] your hands often and thoroughly with soap and water for at least 20 seconds. You can use an alcohol-based hand sanitizer if  soap and water are not available and if your hands are not visibly dirty. Avoid touching your eyes, nose, and mouth with unwashed hands.   Prevention Steps for Caregivers and Household Members of Individuals Confirmed to have, or Being Evaluated for, COVID-19 Infection Being Cared for in the Home  If you live with, or provide care at home for, a person confirmed to have, or being evaluated for, COVID-19 infection please follow these guidelines to prevent infection:  Follow healthcare provider's instructions Make sure that you understand and can help the patient follow any healthcare provider instructions for all care.  Provide for the patient's basic needs You should help the patient with basic needs in the home and provide support for getting groceries, prescriptions, and other personal needs.  Monitor the patient's symptoms If they are getting sicker, call his or her medical provider and tell them that the patient has, or is being evaluated for, COVID-19 infection. This will help the healthcare provider's office take steps to keep other people from getting infected. Ask the healthcare provider to call the local or state health department.  Limit the number of people who have contact with the patient  If possible, have only one caregiver for the patient.  Other household members should stay in another home or place of residence. If this is not possible, they should stay  in another room, or be separated from the patient as much as possible. Use a separate bathroom, if available.  Restrict visitors who do not have an essential need to be in the home.  Keep older adults, very young children, and other sick people away from the patient Keep older adults, very young children, and those who have compromised immune systems or chronic health conditions away from the patient. This includes people with chronic heart, lung, or kidney conditions, diabetes, and cancer.  Ensure good  ventilation Make sure that shared spaces in the home have good air flow, such as from an air conditioner or an opened window, weather permitting.  Wash your hands often  Wash your hands often and thoroughly  with soap and water for at least 20 seconds. You can use an alcohol based hand sanitizer if soap and water are not available and if your hands are not visibly dirty.  Avoid touching your eyes, nose, and mouth with unwashed hands.  Use disposable paper towels to dry your hands. If not available, use dedicated cloth towels and replace them when they become wet.  Wear a facemask and gloves  Wear a disposable facemask at all times in the room and gloves when you touch or have contact with the patient's blood, body fluids, and/or secretions or excretions, such as sweat, saliva, sputum, nasal mucus, vomit, urine, or feces.  Ensure the mask fits over your nose and mouth tightly, and do not touch it during use.  Throw out disposable facemasks and gloves after using them. Do not reuse.  Wash your hands immediately after removing your facemask and gloves.  If your personal clothing becomes contaminated, carefully remove clothing and launder. Wash your hands after handling contaminated clothing.  Place all used disposable facemasks, gloves, and other waste in a lined container before disposing them with other household waste.  Remove gloves and wash your hands immediately after handling these items.  Do not share dishes, glasses, or other household items with the patient  Avoid sharing household items. You should not share dishes, drinking glasses, cups, eating utensils, towels, bedding, or other items with a patient who is confirmed to have, or being evaluated for, COVID-19 infection.  After the person uses these items, you should wash them thoroughly with soap and water.  Wash laundry thoroughly  Immediately remove and wash clothes or bedding that have blood, body fluids, and/or  secretions or excretions, such as sweat, saliva, sputum, nasal mucus, vomit, urine, or feces, on them.  Wear gloves when handling laundry from the patient.  Read and follow directions on labels of laundry or clothing items and detergent. In general, wash and dry with the warmest temperatures recommended on the label.  Clean all areas the individual has used often  Clean all touchable surfaces, such as counters, tabletops, doorknobs, bathroom fixtures, toilets, phones, keyboards, tablets, and bedside tables, every day. Also, clean any surfaces that may have blood, body fluids, and/or secretions or excretions on them.  Wear gloves when cleaning surfaces the patient has come in contact with.  Use a diluted bleach solution (e.g., dilute bleach with 1 part bleach and 10 parts water) or a household disinfectant with a label that says EPA-registered for coronaviruses. To make a bleach solution at home, add 1 tablespoon of bleach to 1 quart (4 cups) of water. For a larger supply, add  cup of bleach to 1 gallon (16 cups) of water.  Read labels of cleaning products and follow recommendations provided on product labels. Labels contain instructions for safe and effective use of the cleaning product including precautions you should take when applying the product, such as wearing gloves or eye protection and making sure you have good ventilation during use of the product.  Remove gloves and wash hands immediately after cleaning.  Monitor yourself for signs and symptoms of illness Caregivers and household members are considered close contacts, should monitor their health, and will be asked to limit movement outside of the home to the extent possible. Follow the monitoring steps for close contacts listed on the symptom monitoring form.   ? If you have additional questions, contact your local health department or call the epidemiologist on call at [phone] (available 24/7). ? This guidance  is subject  to change. For the most up-to-date guidance from [facility], please refer to their website: TripMetro.huhttps://www.cdc.gov/coronavirus/2019-ncov/hcp/guidance-prevent-spread.html   ED Prescriptions    None     PDMP not reviewed this encounter.   Lew DawesWieters, Corri Delapaz C, [place]PA-C 01/21/19 1207

## 2019-01-22 LAB — NOVEL CORONAVIRUS, NAA (HOSP ORDER, SEND-OUT TO REF LAB; TAT 18-24 HRS): SARS-CoV-2, NAA: NOT DETECTED

## 2019-01-26 ENCOUNTER — Telehealth: Payer: Self-pay | Admitting: Emergency Medicine

## 2019-01-26 NOTE — Telephone Encounter (Signed)
Pt called requesting covid test results. Results reported as negative.  

## 2019-04-21 ENCOUNTER — Ambulatory Visit (HOSPITAL_COMMUNITY)
Admission: EM | Admit: 2019-04-21 | Discharge: 2019-04-21 | Disposition: A | Discharge status: Home / Self Care | Payer: Medicare HMO | Attending: Emergency Medicine | Admitting: Emergency Medicine

## 2019-04-21 ENCOUNTER — Ambulatory Visit (INDEPENDENT_AMBULATORY_CARE_PROVIDER_SITE_OTHER): Payer: Medicare HMO

## 2019-04-21 ENCOUNTER — Other Ambulatory Visit: Payer: Self-pay

## 2019-04-21 ENCOUNTER — Encounter (HOSPITAL_COMMUNITY): Payer: Self-pay

## 2019-04-21 DIAGNOSIS — R10A2 Flank pain, left side: Secondary | ICD-10-CM

## 2019-04-21 DIAGNOSIS — R109 Unspecified abdominal pain: Secondary | ICD-10-CM | POA: Diagnosis not present

## 2019-04-21 DIAGNOSIS — R0781 Pleurodynia: Secondary | ICD-10-CM | POA: Diagnosis not present

## 2019-04-21 DIAGNOSIS — R1032 Left lower quadrant pain: Secondary | ICD-10-CM

## 2019-04-21 MED ORDER — NAPROXEN 500 MG PO TABS: 500.0000 mg | ORAL_TABLET | Freq: Two times a day (BID) | ORAL | 0 refills | Status: DC

## 2019-04-21 NOTE — Discharge Instructions (Addendum)
No fractures on xray Naprosyn twice daily with food to help with pain and inflammation in chest/side May alternate ice and heat to area Follow up if not improving over the next 1-2 weeks or developing difficulty breathing
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
## 2021-07-18 ENCOUNTER — Encounter (HOSPITAL_COMMUNITY): Payer: Self-pay

## 2021-07-18 ENCOUNTER — Ambulatory Visit (HOSPITAL_COMMUNITY)
Admission: EM | Admit: 2021-07-18 | Discharge: 2021-07-18 | Disposition: A | Discharge status: Home / Self Care | Payer: Medicare HMO | Attending: Family Medicine | Admitting: Family Medicine

## 2021-07-18 DIAGNOSIS — M542 Cervicalgia: Secondary | ICD-10-CM

## 2021-07-18 DIAGNOSIS — M549 Dorsalgia, unspecified: Secondary | ICD-10-CM

## 2021-07-18 MED ORDER — CYCLOBENZAPRINE HCL 10 MG PO TABS: 10.0000 mg | ORAL_TABLET | Freq: Three times a day (TID) | ORAL | 0 refills | Status: AC | PRN

## 2021-07-18 MED ORDER — IBUPROFEN 800 MG PO TABS: 800.0000 mg | ORAL_TABLET | Freq: Three times a day (TID) | ORAL | 0 refills | Status: AC | PRN

## 2021-07-18 MED ORDER — KETOROLAC TROMETHAMINE 30 MG/ML IJ SOLN
INTRAMUSCULAR | Status: AC
  Filled 2021-07-18: qty 1

## 2021-07-18 MED ORDER — KETOROLAC TROMETHAMINE 30 MG/ML IJ SOLN
30.0000 mg | Freq: Once | INTRAMUSCULAR | Status: AC
  Administered 2021-07-18: 30 mg via INTRAMUSCULAR

## 2021-07-18 NOTE — Discharge Instructions (Addendum)
You have been given a shot of Toradol 30 mg today.  ? ?Take ibuprofen 800 mg--1 tab every 8 hours as needed for pain.  ? ?Take cyclobenzaprine 10 mg--1 tablet 3 times daily as needed for muscle spasm or muscle pain.  This medication can make you sleepy. ? ?A heating pad can help the sore areas too ?
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
## 2021-11-22 ENCOUNTER — Encounter (HOSPITAL_COMMUNITY): Payer: Self-pay | Admitting: Emergency Medicine

## 2021-11-22 ENCOUNTER — Ambulatory Visit (HOSPITAL_COMMUNITY)
Admission: EM | Admit: 2021-11-22 | Discharge: 2021-11-22 | Disposition: A | Discharge status: Home / Self Care | Payer: Medicare HMO | Attending: Internal Medicine | Admitting: Internal Medicine

## 2021-11-22 DIAGNOSIS — N4889 Other specified disorders of penis: Secondary | ICD-10-CM

## 2021-11-22 LAB — HIV ANTIBODY (ROUTINE TESTING W REFLEX): HIV Screen 4th Generation wRfx: NONREACTIVE

## 2021-11-22 LAB — HEPATITIS C ANTIBODY: HCV Ab: NONREACTIVE

## 2021-11-22 NOTE — Discharge Instructions (Addendum)
You were tested for sexually transmitted infections today We will call you if your results are positive

## 2021-11-22 NOTE — ED Provider Notes (Signed)
[facility]   [id number] 11/22/21 Arrival Time: 0258  ASSESSMENT & PLAN:  1. Penile irritation    -Patient with penile burning/irritation after sexual activity for the last several months.  He was tested for STIs today.  We will call if results are positive.  He was advised to follow-up with his primary doctor to discuss erectile dysfunction.  All questions were answered he agrees to plan.   No orders of the defined types were placed in this encounter.    Discharge Instructions      You were tested for sexually transmitted infections today We will call you if your results are positive      Follow-up Information     [facility]   Specialty: Family Medicine Why: If symptoms worsen Contact information: [address] [place] [address] [phone]                  Reviewed expectations re: course of current medical issues. Questions answered. Outlined signs and symptoms indicating need for more acute intervention. Patient verbalized understanding. After Visit Summary given.   SUBJECTIVE: Pleasant 31 year old male comes to urgent care to be evaluated for genital complaints.  He says over the last several months he has had difficulty getting and maintaining an erection.  He also notices that he has burning in the penis after sexual activity.  He is sexually active with 1 male partner.  They use condoms.  He has never had an STD before.  He denies any penile lesions or discharge or dysuria.  He would like STD testing today.  No LMP for male patient. History reviewed. No pertinent surgical history.   OBJECTIVE:  Vitals:   11/22/21 0927  BP: 122/85  Pulse: 72  Resp: 16  Temp: 98.1 F (36.7 C)  TempSrc: Oral  SpO2: 96%     Physical Exam Vitals reviewed.  Constitutional:      Appearance: Normal appearance. He is not ill-appearing.  Cardiovascular:     Rate and Rhythm: Normal rate.  Pulmonary:      Effort: Pulmonary effort is normal.  Genitourinary:    Comments: Deffered by patient Skin:    General: Skin is warm.  Neurological:     General: No focal deficit present.  Psychiatric:        Mood and Affect: Mood normal.      Labs: Results for orders placed or performed during the hospital encounter of 01/20/19  Novel Coronavirus, NAA (Hosp order, Send-out to Ref Lab; TAT 18-24 hrs   Specimen: Nasopharyngeal Swab; Respiratory  Result Value Ref Range   SARS-CoV-2, NAA NOT DETECTED NOT DETECTED   Coronavirus Source NASOPHARYNGEAL    Labs Reviewed  RPR  HIV ANTIBODY (ROUTINE TESTING W REFLEX)  HEPATITIS C ANTIBODY  CYTOLOGY, (ORAL, ANAL, URETHRAL) ANCILLARY ONLY    Imaging: No results found.   No Known Allergies                                             History reviewed. No pertinent past medical history.  Social History   Socioeconomic History   Marital status: Significant Other    Spouse name: Not on file   Number of children: Not on file   Years of education: Not on file   Highest education level: Not on file  Occupational History  Not on file  Tobacco Use   Smoking status: Never   Smokeless tobacco: Never  Substance and Sexual Activity   Alcohol use: Yes   Drug use: No   Sexual activity: Not on file  Other Topics Concern   Not on file  Social History Narrative   ** Merged History Encounter **       Social Determinants of Health   Financial Resource Strain: Not on file  Food Insecurity: Not on file  Transportation Needs: Not on file  Physical Activity: Not on file  Stress: Not on file  Social Connections: Not on file  Intimate Partner Violence: Not on file    Family History  Problem Relation Age of Onset   Hypertension Mother    Hypertension Father    Diabetes Father       Yanelly Cantrelle, Baldemar Friday, MD 11/22/21 1042

## 2021-11-22 NOTE — ED Triage Notes (Signed)
Pt reports having a hard time staying "erect" and states he has also noticed a burning sensation in general. Denies penile discharge. Symptoms have been ongoing for 5 months.

## 2021-11-23 LAB — CYTOLOGY, (ORAL, ANAL, URETHRAL) ANCILLARY ONLY
Chlamydia: NEGATIVE
Comment: NEGATIVE
Comment: NEGATIVE
Comment: NORMAL
Neisseria Gonorrhea: NEGATIVE
Trichomonas: NEGATIVE

## 2021-11-23 LAB — RPR: RPR Ser Ql: NONREACTIVE
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
## 2023-08-08 ENCOUNTER — Ambulatory Visit: Admitting: Podiatry
